# Patient Record
Sex: Male | Born: 2000 | Race: White | Hispanic: No | Marital: Single
Health system: Southern US, Community
[De-identification: ages and names within clinical notes are randomized; demographics above are authoritative.]

## PROBLEM LIST (undated history)

## (undated) DIAGNOSIS — T8484XA Pain due to internal orthopedic prosthetic devices, implants and grafts, initial encounter: Secondary | ICD-10-CM

## (undated) HISTORY — PX: APPENDECTOMY: SHX54

## (undated) HISTORY — PX: REPAIR OF SYNDACTYLY HAND: SUR1196

---
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
## 2023-08-30 ENCOUNTER — Ambulatory Visit (HOSPITAL_COMMUNITY)
Admission: RE | Admit: 2023-08-30 | Discharge: 2023-08-30 | Disposition: A | Discharge status: Home / Self Care | Payer: 59 | Source: Ambulatory Visit | Attending: Internal Medicine | Admitting: Internal Medicine

## 2023-08-30 ENCOUNTER — Other Ambulatory Visit (HOSPITAL_COMMUNITY): Payer: Self-pay | Admitting: Internal Medicine

## 2023-08-30 DIAGNOSIS — M25571 Pain in right ankle and joints of right foot: Secondary | ICD-10-CM | POA: Diagnosis present

## 2024-06-19 ENCOUNTER — Encounter: Payer: Self-pay | Admitting: Advanced Practice Midwife
